# Patient Record
Sex: Male | Born: 1970 | Race: White | Hispanic: No | Marital: Married | Smoking: Current some day smoker
Health system: Southern US, Community
[De-identification: ages and names within clinical notes are randomized; demographics above are authoritative.]

---

## 2009-04-08 ENCOUNTER — Encounter: Admission: RE | Admit: 2009-04-08 | Discharge: 2009-04-08 | Payer: Self-pay | Admitting: Occupational Medicine

## 2014-05-19 ENCOUNTER — Emergency Department (HOSPITAL_COMMUNITY)
Admission: EM | Admit: 2014-05-19 | Discharge: 2014-05-20 | Disposition: A | Discharge status: Home / Self Care | Payer: Worker's Compensation | Attending: Emergency Medicine | Admitting: Emergency Medicine

## 2014-05-19 ENCOUNTER — Encounter (HOSPITAL_COMMUNITY): Payer: Self-pay | Admitting: Emergency Medicine

## 2014-05-19 DIAGNOSIS — Z7721 Contact with and (suspected) exposure to potentially hazardous body fluids: Secondary | ICD-10-CM | POA: Diagnosis present

## 2014-05-19 DIAGNOSIS — Z87891 Personal history of nicotine dependence: Secondary | ICD-10-CM | POA: Diagnosis not present

## 2014-05-19 DIAGNOSIS — Z79899 Other long term (current) drug therapy: Secondary | ICD-10-CM | POA: Insufficient documentation
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
## 2014-05-20 LAB — HEPATITIS PANEL, ACUTE
HCV Ab: NEGATIVE
HEP B C IGM: NONREACTIVE
HEP B S AG: NEGATIVE
Hep A IgM: NONREACTIVE
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
## 2014-05-20 NOTE — ED Notes (Signed)
After blood draw, Pt stated he felt lightheaded. Pt told to sit back in chair and take deep breaths. Pt noted to be pale and sweating. Pt placed on pulse ox, HR noted to be irregular. RN Janna ArchLauren M informed. Pt placed on cardiac monitor.

## 2014-05-20 NOTE — ED Notes (Signed)
Pt given information to follow up for repeat blood work.

## 2017-04-17 ENCOUNTER — Encounter (HOSPITAL_COMMUNITY): Payer: Self-pay | Admitting: Emergency Medicine

## 2017-04-17 ENCOUNTER — Emergency Department (HOSPITAL_COMMUNITY)
Admission: EM | Admit: 2017-04-17 | Discharge: 2017-04-17 | Disposition: A | Discharge status: Home / Self Care | Payer: No Typology Code available for payment source | Attending: Emergency Medicine | Admitting: Emergency Medicine

## 2017-04-17 ENCOUNTER — Emergency Department (HOSPITAL_COMMUNITY): Payer: No Typology Code available for payment source

## 2017-04-17 ENCOUNTER — Other Ambulatory Visit: Payer: Self-pay

## 2017-04-17 DIAGNOSIS — Y9389 Activity, other specified: Secondary | ICD-10-CM | POA: Diagnosis not present

## 2017-04-17 DIAGNOSIS — Y92410 Unspecified street and highway as the place of occurrence of the external cause: Secondary | ICD-10-CM | POA: Insufficient documentation

## 2017-04-17 DIAGNOSIS — Y990 Civilian activity done for income or pay: Secondary | ICD-10-CM | POA: Insufficient documentation

## 2017-04-17 DIAGNOSIS — S161XXA Strain of muscle, fascia and tendon at neck level, initial encounter: Secondary | ICD-10-CM | POA: Diagnosis not present

## 2017-04-17 DIAGNOSIS — S199XXA Unspecified injury of neck, initial encounter: Secondary | ICD-10-CM | POA: Diagnosis present

## 2017-04-17 DIAGNOSIS — F17290 Nicotine dependence, other tobacco product, uncomplicated: Secondary | ICD-10-CM | POA: Insufficient documentation

## 2017-04-17 MED ORDER — IBUPROFEN 600 MG PO TABS
600.0000 mg | ORAL_TABLET | Freq: Four times a day (QID) | ORAL | 0 refills | Status: AC | PRN
Start: 2017-04-17 — End: ?

## 2017-04-17 MED ORDER — METHOCARBAMOL 500 MG PO TABS: 500.0000 mg | ORAL_TABLET | Freq: Two times a day (BID) | ORAL | 0 refills | Status: AC
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
## 2017-04-17 NOTE — ED Notes (Signed)
ED Provider at bedside. 

## 2017-05-06 ENCOUNTER — Other Ambulatory Visit: Payer: Self-pay | Admitting: Chiropractic Medicine

## 2017-05-06 ENCOUNTER — Ambulatory Visit
Admission: RE | Admit: 2017-05-06 | Discharge: 2017-05-06 | Disposition: A | Discharge status: Home / Self Care | Payer: Worker's Compensation | Source: Ambulatory Visit | Attending: Chiropractic Medicine | Admitting: Chiropractic Medicine
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
